# Patient Record
Sex: Male | Born: 1990 | Race: Black or African American | Hispanic: No | Marital: Married | Smoking: Never smoker
Health system: Southern US, Community
[De-identification: ages and names within clinical notes are randomized; demographics above are authoritative.]

## PROBLEM LIST (undated history)

## (undated) DIAGNOSIS — I499 Cardiac arrhythmia, unspecified: Secondary | ICD-10-CM

## (undated) DIAGNOSIS — I10 Essential (primary) hypertension: Secondary | ICD-10-CM

---

## 2008-07-20 ENCOUNTER — Other Ambulatory Visit: Payer: Self-pay | Admitting: Emergency Medicine

## 2008-07-20 ENCOUNTER — Inpatient Hospital Stay (HOSPITAL_COMMUNITY): Admission: AD | Admit: 2008-07-20 | Discharge: 2008-07-23 | Payer: Self-pay | Admitting: Internal Medicine

## 2008-07-20 ENCOUNTER — Encounter (INDEPENDENT_AMBULATORY_CARE_PROVIDER_SITE_OTHER): Payer: Self-pay | Admitting: Cardiology

## 2008-07-21 ENCOUNTER — Encounter (INDEPENDENT_AMBULATORY_CARE_PROVIDER_SITE_OTHER): Payer: Self-pay | Admitting: Internal Medicine

## 2008-08-30 ENCOUNTER — Emergency Department (HOSPITAL_COMMUNITY): Admission: EM | Admit: 2008-08-30 | Discharge: 2008-08-30 | Payer: Self-pay | Admitting: Emergency Medicine

## 2010-04-23 ENCOUNTER — Emergency Department (HOSPITAL_COMMUNITY)
Admission: EM | Admit: 2010-04-23 | Discharge: 2010-04-23 | Disposition: A | Discharge status: Home / Self Care | Payer: Self-pay | Attending: Emergency Medicine | Admitting: Emergency Medicine

## 2010-04-23 DIAGNOSIS — B86 Scabies: Secondary | ICD-10-CM | POA: Insufficient documentation

## 2010-06-03 LAB — CBC
HCT: 42.9 % (ref 39.0–52.0)
Hemoglobin: 14.4 g/dL (ref 13.0–17.0)
Hemoglobin: 14.7 g/dL (ref 13.0–17.0)
MCV: 91.1 fL (ref 78.0–100.0)
Platelets: 152 10*3/uL (ref 150–400)
RBC: 4.65 MIL/uL (ref 4.22–5.81)
RDW: 13.6 % (ref 11.5–15.5)
WBC: 6.1 10*3/uL (ref 4.0–10.5)
WBC: 6.3 10*3/uL (ref 4.0–10.5)

## 2010-06-03 LAB — GLUCOSE, CAPILLARY
Glucose-Capillary: 113 mg/dL — ABNORMAL HIGH (ref 70–99)
Glucose-Capillary: 74 mg/dL (ref 70–99)
Glucose-Capillary: 78 mg/dL (ref 70–99)
Glucose-Capillary: 93 mg/dL (ref 70–99)
Glucose-Capillary: 97 mg/dL (ref 70–99)
Glucose-Capillary: 98 mg/dL (ref 70–99)

## 2010-06-03 LAB — CARDIAC PANEL(CRET KIN+CKTOT+MB+TROPI)
CK, MB: 1.5 ng/mL (ref 0.3–4.0)
CK, MB: 1.7 ng/mL (ref 0.3–4.0)
CK, MB: 1.8 ng/mL (ref 0.3–4.0)
Relative Index: 0.3 (ref 0.0–2.5)
Relative Index: 0.3 (ref 0.0–2.5)
Relative Index: 0.3 (ref 0.0–2.5)
Total CK: 581 U/L — ABNORMAL HIGH (ref 7–232)
Total CK: 611 U/L — ABNORMAL HIGH (ref 7–232)
Troponin I: 0.01 ng/mL (ref 0.00–0.06)
Troponin I: 0.01 ng/mL (ref 0.00–0.06)
Troponin I: 0.02 ng/mL (ref 0.00–0.06)

## 2010-06-03 LAB — URINALYSIS, ROUTINE W REFLEX MICROSCOPIC
Glucose, UA: 100 mg/dL — AB
Ketones, ur: 15 mg/dL — AB
Specific Gravity, Urine: 1.025 (ref 1.005–1.030)
pH: 7 (ref 5.0–8.0)

## 2010-06-03 LAB — COMPREHENSIVE METABOLIC PANEL
ALT: 15 U/L (ref 0–53)
AST: 23 U/L (ref 0–37)
AST: 28 U/L (ref 0–37)
Albumin: 4 g/dL (ref 3.5–5.2)
Calcium: 8.3 mg/dL — ABNORMAL LOW (ref 8.4–10.5)
Chloride: 109 mEq/L (ref 96–112)
Creatinine, Ser: 1.42 mg/dL (ref 0.4–1.5)
GFR calc Af Amer: >60 mL/min (ref >60)
GFR calc Af Amer: >60 mL/min (ref >60)
Sodium: 139 mEq/L (ref 135–145)
Total Bilirubin: 1.3 mg/dL — ABNORMAL HIGH (ref 0.3–1.2)
Total Protein: 6.2 g/dL (ref 6.0–8.3)

## 2010-06-03 LAB — DIFFERENTIAL
Basophils Absolute: 0 10*3/uL (ref 0.0–0.1)
Eosinophils Relative: 2 % (ref 0–5)
Lymphocytes Relative: 18 % (ref 12–46)
Lymphs Abs: 1.1 10*3/uL (ref 0.7–4.0)
Monocytes Absolute: 0.7 10*3/uL (ref 0.1–1.0)

## 2010-06-03 LAB — BASIC METABOLIC PANEL
CO2: 28 mEq/L (ref 19–32)
CO2: 30 mEq/L (ref 19–32)
Calcium: 8.7 mg/dL (ref 8.4–10.5)
Calcium: 8.9 mg/dL (ref 8.4–10.5)
GFR calc Af Amer: >60 mL/min (ref >60)
GFR calc Af Amer: >60 mL/min (ref >60)
GFR calc non Af Amer: >60 mL/min (ref >60)
GFR calc non Af Amer: >60 mL/min (ref >60)
GFR calc non Af Amer: >60 mL/min (ref >60)
Glucose, Bld: 89 mg/dL (ref 70–99)
Glucose, Bld: 93 mg/dL (ref 70–99)
Potassium: 3.6 mEq/L (ref 3.5–5.1)
Potassium: 3.9 mEq/L (ref 3.5–5.1)
Sodium: 139 mEq/L (ref 135–145)
Sodium: 143 mEq/L (ref 135–145)
Sodium: 145 mEq/L (ref 135–145)

## 2010-06-03 LAB — RAPID URINE DRUG SCREEN, HOSP PERFORMED
Barbiturates: NOT DETECTED
Cocaine: NOT DETECTED
Opiates: NOT DETECTED

## 2010-06-03 LAB — PHOSPHORUS: Phosphorus: 3.6 mg/dL (ref 2.3–4.6)

## 2010-06-03 LAB — RAPID STREP SCREEN (MED CTR MEBANE ONLY): Streptococcus, Group A Screen (Direct): NEGATIVE

## 2010-06-03 LAB — POCT CARDIAC MARKERS
CKMB, poc: <1 ng/mL — ABNORMAL LOW (ref 1.0–8.0)
Troponin i, poc: <0.05 ng/mL (ref 0.00–0.09)

## 2010-06-03 LAB — TSH: TSH: 1.284 u[IU]/mL (ref 0.350–4.500)

## 2010-06-08 IMAGING — CR DG CHEST 2V
2 series · 2 of 2 positions shown · non-contrast
Comparison: None

CLINICAL DATA: Shortness of breath, chest pain, syncope

CHEST - 2 VIEW

[w chest pa]
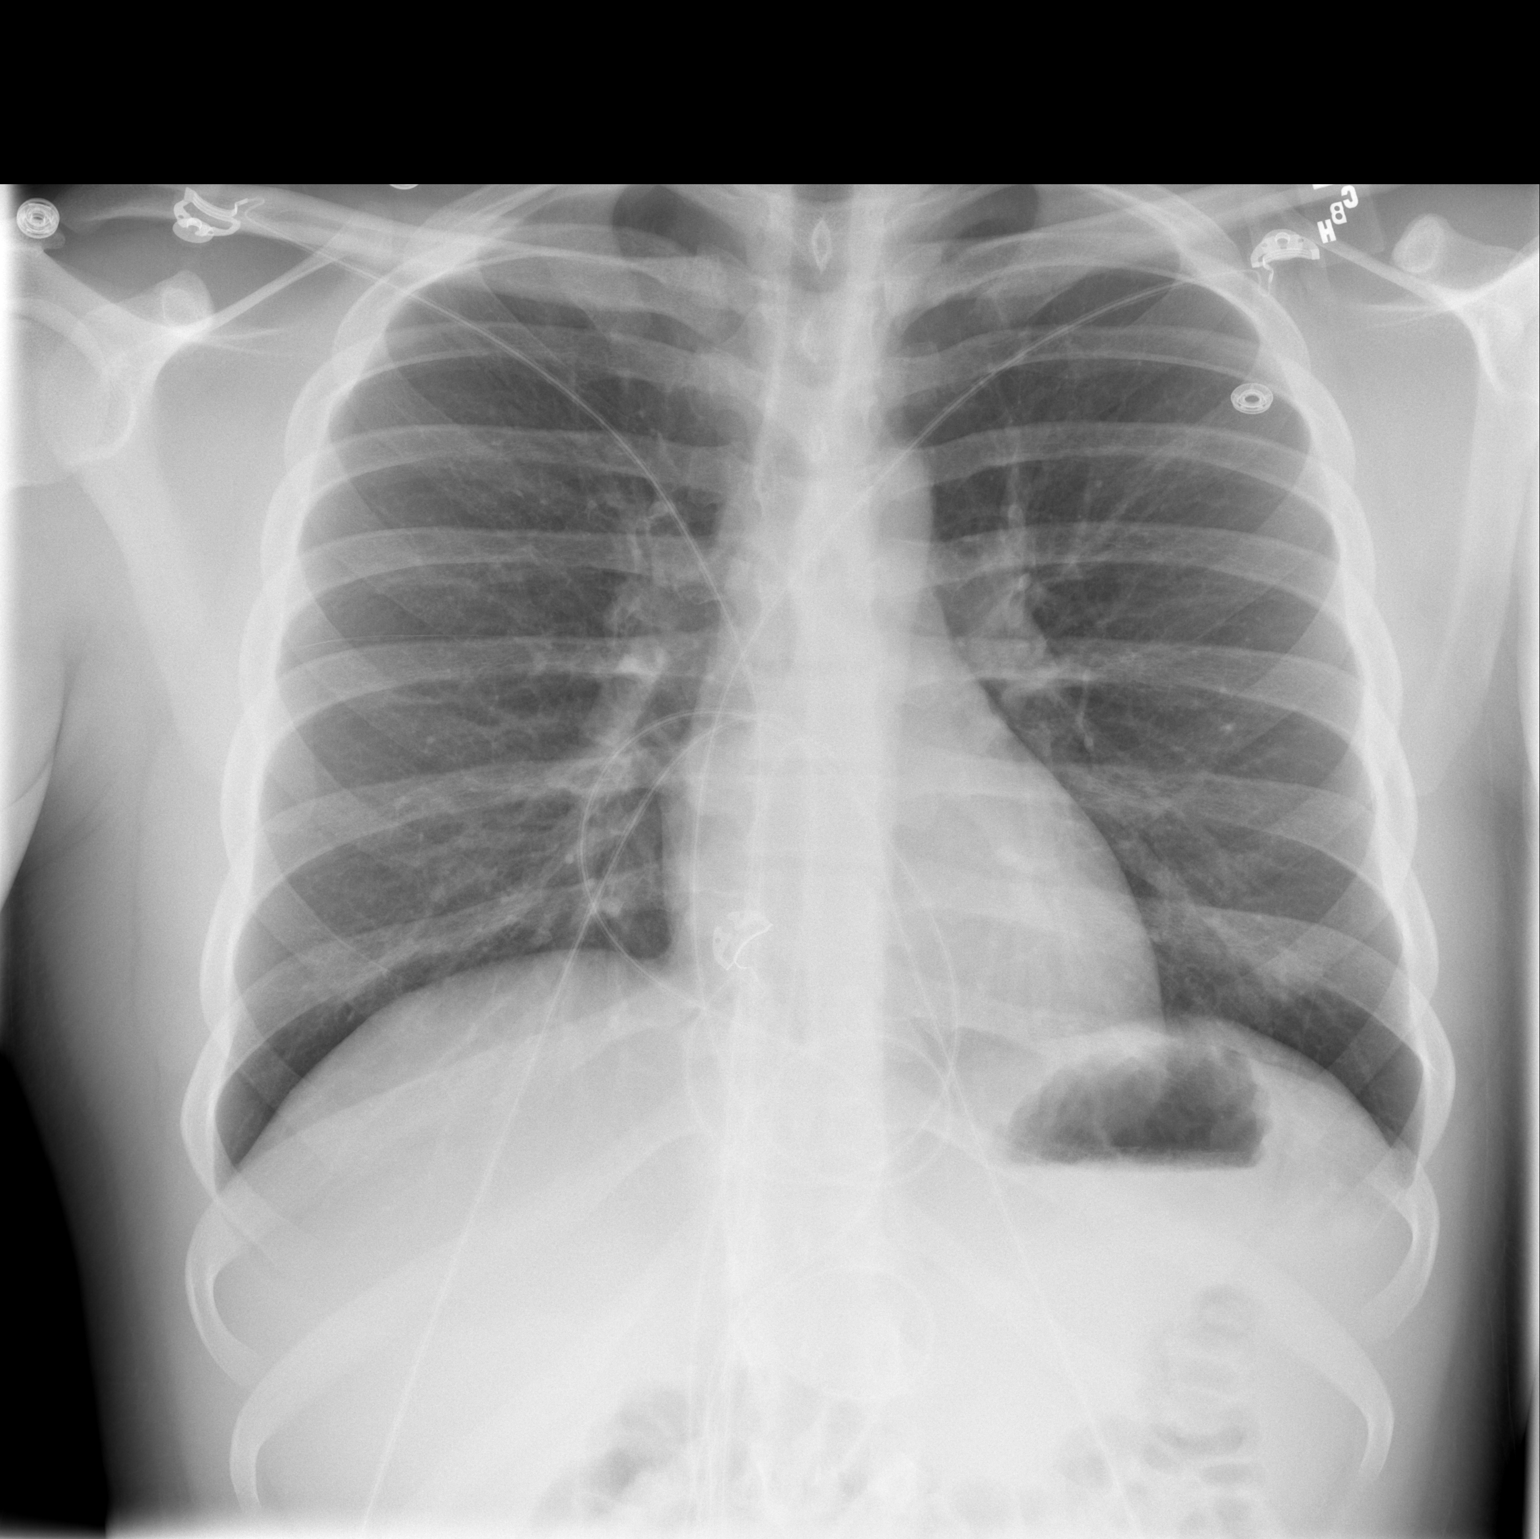

[w chest lat]
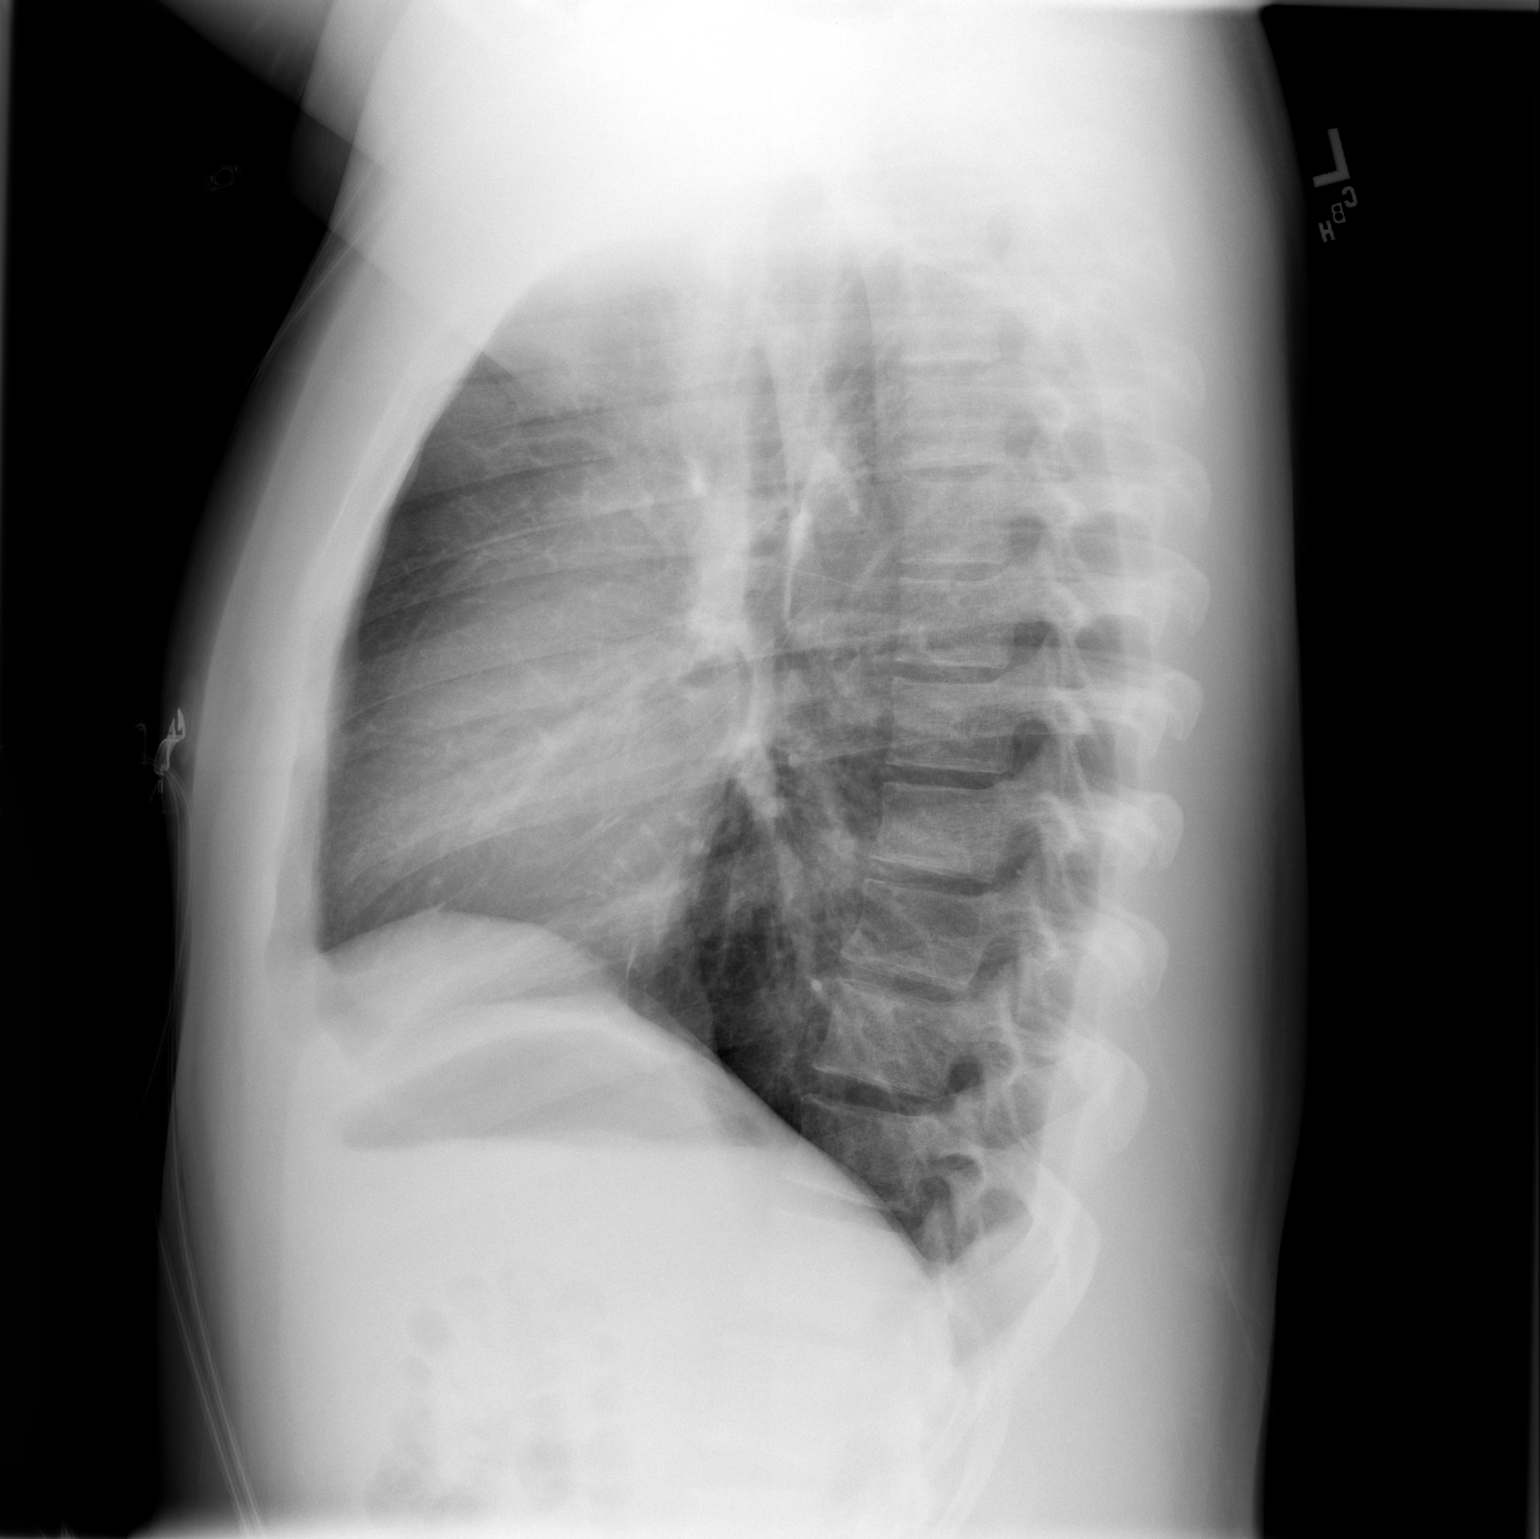

[2 of 2 positions shown; findings below may reference images not displayed]

FINDINGS: Cardiomediastinal silhouette is within normal limits. The
lungs are clear. No pleural effusion.  No pneumothorax.  No acute
osseous abnormality.
IMPRESSION: No acute cardiopulmonary process.

## 2010-07-08 NOTE — Discharge Summary (Signed)
NAMEROCKEY, GUARINO               ACCOUNT NO.:  [id number]   MEDICAL RECORD NO.:  [id number]          PATIENT TYPE:  INP   LOCATION:  5033                         FACILITY:  [facility]   PHYSICIAN:  Peggye Pitt, M.D. DATE OF BIRTH:  June 19, 1990   DATE OF ADMISSION:  07/20/2008  DATE OF DISCHARGE:  07/23/2008                               DISCHARGE SUMMARY   DISCHARGE DIAGNOSES:  1. Chest pain, likely secondary to gastroesophageal reflux disease.  2. Vasovagal syncope.  3. Hypertension.  4. Hypokalemia.   Discharge medications include:  1. Protonix 40 mg p.o. twice daily.  2. Lisinopril 10 mg daily.  3. Hydrochlorothiazide 12.5 mg daily.   DISPOSITION AND FOLLOWUP:  Mr. Jason Werner is discharged home today in  stable condition.  He has selected Dr. Della Goo as his new  primary care physician.  He has been provided with her office number to  call for a hospital followup appointment.  At that time, a BMET should  be drawn to follow his renal function, a newly started ACE inhibitor and  hydrochlorothiazide for blood pressure.  He has also been instructed to  call Dr. Jacinto Halim with [facility] for an outpatient  stress test that Dr. Jacinto Halim suggest that he have.   CONSULTATION IN THIS HOSPITALIZATION:  Vonna Kotyk R. Jacinto Halim, MD with Cardiology.   Images and procedures include:  1. A chest x-ray on Jul 20, 2008, that showed no acute cardiopulmonary      process.  2. A CT angiogram of the chest on Jul 21, 2008, that showed no      pulmonary embolism with trace left pleural effusion and minimal      left lower lobe atelectasis.  3. He also had a 2D echocardiogram on Jul 21, 2008, that showed an      ejection fraction of 60-65% with no wall motion abnormalities, no      diastolic dysfunction.   HISTORY AND PHYSICAL EXAMINATION:  For full details, please see  dictation by Dr. Oralia Rud on Jul 20, 2008, but in brief, Jason Werner is a  20 year old African American young man  with no significant past medical  history who was transferred from the [facility] Emergency Department  secondary to a syncopal event with abnormal EKG changes consistent of  inferolateral T-wave inversions.   HOSPITAL COURSE:  1. Chest pain.  Because of an elevated D-dimer, a CT scan was done      that showed no evidence of a pulmonary embolism.  However, we were      concerned for a cardiac etiology given his profound T-wave      inversions in the anterolateral leads on his EKG.  A 2D echo was      performed and that was essentially normal as above.  Cardiology was      consulted and they think that the chest pain is likely secondary to      GERD, however, they have recommended that he have an outpatient      stress test given his EKG changes.  The patient has been provided  with number for Dr. Jacinto Halim to call and schedule this appointment.      Dr. 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  2. Syncopal event, most likely a vasovagal.  He did not have any      evidence of hypertrophic obstructive cardiomyopathy on his EKG.  3. Blood pressure.  He did have elevated blood pressures to 150s-160s      systolic while in the hospital.  He has been started on lisinopril      and hydrochlorothiazide and this will need to be followed up in the      outpatient setting.  4. Rest of the issue is not a problem in this hospitalization.  5. Vial signs on the day of discharge; blood pressure 130/82, heart      rate 75, respirations 28, O2 sats 98% on room air with a      temperature of 98.1.  6. Labs on the day of discharge; sodium 139, potassium 3.4, chloride      104, bicarb 28, BUN 6, creatinine 1.23 with a glucose of 108.  WBC      6.3, hemoglobin 14.7, and a platelet count of 147.      Peggye Pitt, M.D.  Electronically Signed     EH/MEDQ  D:  07/23/2008  T:   07/23/2008  Job:  [id number]   cc:   Della Goo, M.D.  Cristy Hilts. Jacinto Halim, MD

## 2010-07-08 NOTE — H&P (Signed)
Jason Werner, Jason Werner               ACCOUNT NO.:  [id number]   MEDICAL RECORD NO.:  [id number]          PATIENT TYPE:  INP   LOCATION:  4704                         FACILITY:  [facility]   PHYSICIAN:  Darryl D. Prime, MD    DATE OF BIRTH:  11-30-1990   DATE OF ADMISSION:  07/20/2008  DATE OF DISCHARGE:                              HISTORY & PHYSICAL   The patient is full code.  He has no primary care physician.  He is  being admitted to [facility].   CHIEF COMPLAINT:  He passed out.   HISTORY OF PRESENT ILLNESS:  Jason Werner is a 20 year old male with no  significant past medical history.  He was transferred from [facility] ED  for low blood sugar, altered mental status, syncope, abnormal EKG,  nausea, and vomiting.  The patient notes that he had a sore throat  starting on last night.  He does have frequent sore throats.  His sore  throat was so severe, he had significantly decreased p.o. intake over  the last 12 hours.  The patient apparently had nausea and vomiting this  morning before he went into work outside of the [employer] where  he works.  He then because of the severity of the sore throat took a  Motrin and then sat down.  The patient notes shortly thereafter, he  passed out and felt something hard on his head when he came to, unsure  of how long he was out, no chest pain or palpitations prior to passing  out.  He notes people trying to call him but he was unable to respond.  On transport via EMS, in the ambulance, he notes he came to sometimes  but went out sometimes.  A blood sugar checked by EMS was 53.  The  patient in the ED noted chest pain for 5 minutes, sharp, substernal.  There was no radiation, no sweats or shortness of breath when it  occurred.  The patient notes he does have occasional chest pains for  many years.  He also has decreased exercise tolerance, he thinks when  compared to his peers when he plays football.  The patient denies any  alcohol or illicit drug use.  He denies any tobacco use.  The patient in  the emergency room was given IV fluids, ketorolac IV, Zofran IV, and D50  and notes feeling better.  The patient denies any cough, dysuria or  fever.  He denies any rashes, joint pain or swelling.  He denies any  black stools or bloody stools, no diarrhea.   PAST MEDICAL HISTORY AND PAST SURGICAL HISTORY:  None.   ALLERGIES:  No known drug allergies.   MEDICATIONS:  None.   SOCIAL HISTORY:  No tobacco, alcohol or drug use.   FAMILY HISTORY:  No diabetes in the family, no premature coronary artery  disease.   REVIEW OF SYSTEMS:  A 14 point review of systems is negative unless  stated above.   PHYSICAL EXAMINATION:  VITAL SIGNS:  He is afebrile at 98.8 with a  respiratory rate of 14,  pulse 78, blood pressure 131/69.  HEENT:  Normocephalic, atraumatic.  Pupils equal, round, and reactive to  light with extraocular movements being intact.  The oropharynx reveals  significant erythema in the posterior oropharynx but no signs of  exudate.  NECK:  Supple with no lymphadenopathy or thyromegaly.  No carotid  bruits.  No jugular venous distention.  LUNGS:  Clear to auscultation bilaterally.  CARDIOVASCULAR:  Regular rate and rhythm.  He does have a 2/6 systolic  ejection murmur heard loudest at the left upper sternal border and does  not get louder with Valsalva.  No rubs or gallops.  Normal S1, S2.  No  S3 or S4.  ABDOMEN:  Soft, nontender and nondistended with no hepatosplenomegaly.  EXTREMITIES:  No clubbing, cyanosis or edema.  NEUROLOGIC:  He is alert and oriented times four with cranial nerves II-  XII grossly intact.  Strength and sensation are grossly intact.  SKIN:  No rashes.  LYMPHATIC EXAM:  No lower extremity edema.  Shotty anterior cervical  lymphadenopathy noted.  PSYCHIATRIC:  Alert and oriented times four with normal mood and affect.  MUSCULOSKELETAL:  Full range of motion with no signs of joint   deformities.   LABORATORY DATA:  UDS is positive for THC.  Sodium 143, potassium 3.2,  chloride 109, bicarb 28, BUN 7, creatinine 1.42.  I do not have a  baseline for his blood work.  Normal liver function tests.  White count  6.1, hemoglobin 14.2, hematocrit 39.9, platelets 152, segs 69.  Cardiac  markers at 1616 were negative.  He had a negative group A Strep screen  swab and a mono spot screen negative.  The patient's EKG showed sinus  rhythm at a vent rate of 78 beats per minute.  He has T-wave inversions,  some asymmetric, some symmetric, V3-V6 and also in I and aVL.  PR  interval 156, QRS 102, QT directed 421.  No prior EKG to compare.   ASSESSMENT/PLAN:  This is a 20 year old male with a history of recent  sore throat, nausea and vomiting and now presents with decreased blood  sugar.  He has an EKG.  He notes chest pain at times including today.  He has apparent decreased exercise tolerance perceived.  He is  hypokalemic at this time.  1. For his low blood sugar, we will check a hemoglobin A1c and a TSH      to rule out diabetes or endocrinopathy.  Will check his blood      sugars q.4 hours and feed him now as the initial workup.  For his      abnormal EKG at his initial workup, we will get a D-dimer to rule      out PE and cardiac markers times three.  Telemetry will be ongoing.      We will get an echocardiogram and check a B type natruretic peptide      and a chest x-ray.  2. Syncope, for initial workup, we will have him on telemetry.  He      will be given IV fluids and we will feed him.  Will check      orthostatics and a urinalysis and echo as above.  3. For his sore throat, he will have Chloraseptic spray at the bedside      but further workup for diagnosis may have a low yield.  4. GI and DVT prophylaxis will be ordered.      Darryl D. Prime, MD  Electronically Signed  DDP/MEDQ  D:  07/20/2008  T:  07/20/2008  Job:  [id number]

## 2015-04-02 ENCOUNTER — Encounter (HOSPITAL_COMMUNITY): Payer: Self-pay

## 2015-04-02 ENCOUNTER — Emergency Department (HOSPITAL_COMMUNITY): Payer: Self-pay

## 2015-04-02 ENCOUNTER — Emergency Department (HOSPITAL_COMMUNITY)
Admission: EM | Admit: 2015-04-02 | Discharge: 2015-04-02 | Disposition: A | Discharge status: Home / Self Care | Payer: Self-pay | Attending: Emergency Medicine | Admitting: Emergency Medicine

## 2015-04-02 DIAGNOSIS — N390 Urinary tract infection, site not specified: Secondary | ICD-10-CM | POA: Insufficient documentation

## 2015-04-02 DIAGNOSIS — F17210 Nicotine dependence, cigarettes, uncomplicated: Secondary | ICD-10-CM | POA: Insufficient documentation

## 2015-04-02 HISTORY — DX: Cardiac arrhythmia, unspecified: I49.9

## 2015-04-02 LAB — COMPREHENSIVE METABOLIC PANEL
ALBUMIN: 4.2 g/dL (ref 3.5–5.0)
ALK PHOS: 49 U/L (ref 38–126)
ALT: 22 U/L (ref 17–63)
AST: 27 U/L (ref 15–41)
Anion gap: 6 (ref 5–15)
BUN: 14 mg/dL (ref 6–20)
CALCIUM: 9.1 mg/dL (ref 8.9–10.3)
CHLORIDE: 106 mmol/L (ref 101–111)
CO2: 28 mmol/L (ref 22–32)
CREATININE: 1.29 mg/dL — AB (ref 0.61–1.24)
GFR calc non Af Amer: >60 mL/min (ref >60)
GLUCOSE: 81 mg/dL (ref 65–99)
Potassium: 4.2 mmol/L (ref 3.5–5.1)
SODIUM: 140 mmol/L (ref 135–145)
Total Bilirubin: 0.8 mg/dL (ref 0.3–1.2)
Total Protein: 7.6 g/dL (ref 6.5–8.1)

## 2015-04-02 LAB — URINALYSIS, ROUTINE W REFLEX MICROSCOPIC
BILIRUBIN URINE: NEGATIVE
GLUCOSE, UA: NEGATIVE mg/dL
Hgb urine dipstick: NEGATIVE
Nitrite: NEGATIVE
PH: 6 (ref 5.0–8.0)
Specific Gravity, Urine: 1.025 (ref 1.005–1.030)

## 2015-04-02 LAB — URINE MICROSCOPIC-ADD ON

## 2015-04-02 LAB — CBC WITH DIFFERENTIAL/PLATELET
BASOS ABS: 0 10*3/uL (ref 0.0–0.1)
BASOS PCT: 0 %
EOS ABS: 0.9 10*3/uL — AB (ref 0.0–0.7)
EOS PCT: 15 %
HCT: 44.8 % (ref 39.0–52.0)
HEMOGLOBIN: 15.6 g/dL (ref 13.0–17.0)
Lymphocytes Relative: 35 %
Lymphs Abs: 2 10*3/uL (ref 0.7–4.0)
MCH: 31.8 pg (ref 26.0–34.0)
MCHC: 34.8 g/dL (ref 30.0–36.0)
MCV: 91.2 fL (ref 78.0–100.0)
MONO ABS: 0.4 10*3/uL (ref 0.1–1.0)
MONOS PCT: 7 %
NEUTROS ABS: 2.6 10*3/uL (ref 1.7–7.7)
Neutrophils Relative %: 43 %
PLATELETS: 175 10*3/uL (ref 150–400)
RBC: 4.91 MIL/uL (ref 4.22–5.81)
RDW: 12.7 % (ref 11.5–15.5)
WBC: 5.9 10*3/uL (ref 4.0–10.5)

## 2015-04-02 MED ORDER — HYDROMORPHONE HCL 1 MG/ML IJ SOLN
0.5000 mg | Freq: Once | INTRAMUSCULAR | Status: AC
  Administered 2015-04-02: 0.5 mg via INTRAVENOUS
  Filled 2015-04-02: qty 1

## 2015-04-02 MED ORDER — DEXTROSE 5 % IV SOLN
1.0000 g | Freq: Once | INTRAVENOUS | Status: AC
  Administered 2015-04-02: 1 g via INTRAVENOUS
  Filled 2015-04-02: qty 10

## 2015-04-02 MED ORDER — TRAMADOL HCL 50 MG PO TABS: 50.0000 mg | ORAL_TABLET | Freq: Four times a day (QID) | ORAL | Status: AC | PRN

## 2015-04-02 MED ORDER — ONDANSETRON HCL 4 MG/2ML IJ SOLN
4.0000 mg | Freq: Once | INTRAMUSCULAR | Status: AC
  Administered 2015-04-02: 4 mg via INTRAVENOUS
  Filled 2015-04-02: qty 2

## 2015-04-02 MED ORDER — CEPHALEXIN 500 MG PO CAPS: 500.0000 mg | ORAL_CAPSULE | Freq: Four times a day (QID) | ORAL | Status: DC

## 2015-04-02 NOTE — ED Provider Notes (Signed)
CSN: [id number]     Arrival date & time 04/02/15  1550 History   First MD Initiated Contact with Patient 04/02/15 1940     Chief Complaint  Patient presents with  . Hematuria     (Consider location/radiation/quality/duration/timing/severity/associated sxs/prior Treatment) Patient is a 25 y.o. male presenting with hematuria. The history is provided by the patient (Patient complains of left flank pain and suprapubic pain no fever chills).  Hematuria This is a new problem. The current episode started yesterday. The problem occurs constantly. The problem has not changed since onset.Associated symptoms include abdominal pain. Pertinent negatives include no chest pain and no headaches. Nothing aggravates the symptoms. Nothing relieves the symptoms.    Past Medical History  Diagnosis Date  . Irregular heart beat    History reviewed. No pertinent past surgical history. No family history on file. Social History  Substance Use Topics  . Smoking status: Current Every Day Smoker  . Smokeless tobacco: None  . Alcohol Use: No    Review of Systems  Constitutional: Negative for appetite change and fatigue.  HENT: Negative for congestion, ear discharge and sinus pressure.   Eyes: Negative for discharge.  Respiratory: Negative for cough.   Cardiovascular: Negative for chest pain.  Gastrointestinal: Positive for abdominal pain. Negative for diarrhea.  Genitourinary: Positive for hematuria. Negative for frequency.  Musculoskeletal: Negative for back pain.  Skin: Negative for rash.  Neurological: Negative for seizures and headaches.  Psychiatric/Behavioral: Negative for hallucinations.      Allergies  Review of patient's allergies indicates no known allergies.  Home Medications   Prior to Admission medications   Medication Sig Start Date End Date Taking? Authorizing Provider  ibuprofen (ADVIL,MOTRIN) 200 MG tablet Take 200 mg by mouth every 6 (six) hours as needed for mild pain.   Yes  Historical Provider, MD  cephALEXin (KEFLEX) 500 MG capsule Take 1 capsule (500 mg total) by mouth 4 (four) times daily. 04/02/15   Bethann Berkshire, MD  traMADol (ULTRAM) 50 MG tablet Take 1 tablet (50 mg total) by mouth every 6 (six) hours as needed. 04/02/15   Bethann Berkshire, MD   BP 122/67 mmHg  Pulse 61  Temp(Src) 98.6 F (37 C) (Oral)  Resp 16  Ht  (1.803 m)  Wt 250 lb (113.399 kg)  BMI 34.88 kg/m2  SpO2 100% Physical Exam  Constitutional: He is oriented to person, place, and time. He appears well-developed.  HENT:  Head: Normocephalic.  Eyes: Conjunctivae and EOM are normal. No scleral icterus.  Neck: Neck supple. No thyromegaly present.  Cardiovascular: Normal rate and regular rhythm.  Exam reveals no gallop and no friction rub.   No murmur heard. Pulmonary/Chest: No stridor. He has no wheezes. He has no rales. He exhibits no tenderness.  Abdominal: He exhibits no distension. There is no tenderness. There is no rebound.  Mild suprapubic tender  Genitourinary:  Tender left flank  Musculoskeletal: Normal range of motion. He exhibits no edema.  Lymphadenopathy:    He has no cervical adenopathy.  Neurological: He is oriented to person, place, and time. He exhibits normal muscle tone. Coordination normal.  Skin: No rash noted. No erythema.  Psychiatric: He has a normal mood and affect. His behavior is normal.    ED Course  Procedures (including critical care time) Labs Review Labs Reviewed  URINALYSIS, ROUTINE W REFLEX MICROSCOPIC (NOT AT [facility]) - Abnormal; Notable for the following:    Ketones, ur TRACE (*)    Protein, ur TRACE (*)  Leukocytes, UA SMALL (*)    All other components within normal limits  CBC WITH DIFFERENTIAL/PLATELET - Abnormal; Notable for the following:    Eosinophils Absolute 0.9 (*)    All other components within normal limits  COMPREHENSIVE METABOLIC PANEL - Abnormal; Notable for the following:    Creatinine, Ser 1.29 (*)    All other components  within normal limits  URINE MICROSCOPIC-ADD ON - Abnormal; Notable for the following:    Squamous Epithelial / LPF 0-5 (*)    Bacteria, UA FEW (*)    All other components within normal limits  URINE CULTURE    Imaging Review Ct Renal Stone Study  04/02/2015  CLINICAL DATA:  Right lower quadrant pain radiating into the right groin and hematuria for 3 days. Initial encounter. EXAM: CT ABDOMEN AND PELVIS WITHOUT CONTRAST TECHNIQUE: Multidetector CT imaging of the abdomen and pelvis was performed following the standard protocol without IV contrast. COMPARISON:  None. FINDINGS: The lung bases are clear.  No pleural or pericardial effusion. There are no renal or ureteral stones on the right or left. No hydronephrosis is identified. The kidneys, ureters and urinary bladder are unremarkable in appearance. Seminal vesicles and prostate gland appear normal. The gallbladder, liver, spleen, adrenal glands and pancreas appear normal. The stomach, small and large bowel and appendix appear normal. No lymphadenopathy or fluid. Bones demonstrate a unilateral pars interarticularis defect on the left at L5 without anterolisthesis. No lytic or sclerotic bony lesion is identified. IMPRESSION: Negative for urinary tract stone. No acute abnormality or finding to explain the patient's symptoms. 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 Electronically Signed   By: Drusilla Kanner M.D.   On: 04/02/2015 21:01   I have personally reviewed and evaluated these images and lab results as part of my medical decision-making.   EKG Interpretation None      MDM   Final diagnoses:  UTI (lower urinary tract infection)    Labs consistent with urinary tract infection CT scan was negative patient will be put on Keflex and Ultram and will follow-up with a PCP    Bethann Berkshire, MD 04/02/15 2152

## 2015-04-02 NOTE — Discharge Instructions (Signed)
Follow-up with [facility] in [place] in one week drink plenty of fluids

## 2015-04-02 NOTE — ED Notes (Signed)
Pt reports pain in r lower quad radiating to groin and has seen blood in urine x 3 days.  Reports pain is in back at times but mostly in front.  Denies history of kidney stones.

## 2015-04-04 LAB — URINE CULTURE

## 2017-10-09 ENCOUNTER — Observation Stay (HOSPITAL_COMMUNITY)
Admission: EM | Admit: 2017-10-09 | Discharge: 2017-10-11 | Disposition: A | Discharge status: Home / Self Care | Payer: Worker's Compensation | Attending: General Surgery | Admitting: General Surgery

## 2017-10-09 ENCOUNTER — Encounter (HOSPITAL_COMMUNITY): Payer: Self-pay

## 2017-10-09 ENCOUNTER — Emergency Department (HOSPITAL_COMMUNITY): Payer: Worker's Compensation

## 2017-10-09 DIAGNOSIS — S20219A Contusion of unspecified front wall of thorax, initial encounter: Secondary | ICD-10-CM | POA: Diagnosis not present

## 2017-10-09 DIAGNOSIS — F430 Acute stress reaction: Secondary | ICD-10-CM | POA: Diagnosis not present

## 2017-10-09 DIAGNOSIS — Y92410 Unspecified street and highway as the place of occurrence of the external cause: Secondary | ICD-10-CM | POA: Diagnosis not present

## 2017-10-09 DIAGNOSIS — N179 Acute kidney failure, unspecified: Secondary | ICD-10-CM | POA: Diagnosis not present

## 2017-10-09 DIAGNOSIS — T07XXXA Unspecified multiple injuries, initial encounter: Secondary | ICD-10-CM

## 2017-10-09 DIAGNOSIS — S060X9A Concussion with loss of consciousness of unspecified duration, initial encounter: Secondary | ICD-10-CM | POA: Diagnosis present

## 2017-10-09 DIAGNOSIS — S069X1A Unspecified intracranial injury with loss of consciousness of 30 minutes or less, initial encounter: Secondary | ICD-10-CM

## 2017-10-09 DIAGNOSIS — S50311A Abrasion of right elbow, initial encounter: Secondary | ICD-10-CM | POA: Insufficient documentation

## 2017-10-09 DIAGNOSIS — S060X1A Concussion with loss of consciousness of 30 minutes or less, initial encounter: Secondary | ICD-10-CM | POA: Diagnosis present

## 2017-10-09 DIAGNOSIS — S060XAA Concussion with loss of consciousness status unknown, initial encounter: Secondary | ICD-10-CM | POA: Diagnosis present

## 2017-10-09 DIAGNOSIS — R404 Transient alteration of awareness: Secondary | ICD-10-CM

## 2017-10-09 HISTORY — DX: Essential (primary) hypertension: I10

## 2017-10-09 LAB — CBC
HCT: 47.7 % (ref 39.0–52.0)
HCT: 45.8 % (ref 39.0–52.0)
HEMOGLOBIN: 15.6 g/dL (ref 13.0–17.0)
Hemoglobin: 15.7 g/dL (ref 13.0–17.0)
MCH: 30.8 pg (ref 26.0–34.0)
MCH: 31.3 pg (ref 26.0–34.0)
MCHC: 32.9 g/dL (ref 30.0–36.0)
MCHC: 34.1 g/dL (ref 30.0–36.0)
MCV: 93.5 fL (ref 78.0–100.0)
MCV: 92 fL (ref 78.0–100.0)
PLATELETS: 194 10*3/uL (ref 150–400)
PLATELETS: 186 10*3/uL (ref 150–400)
RBC: 5.1 MIL/uL (ref 4.22–5.81)
RBC: 4.98 MIL/uL (ref 4.22–5.81)
RDW: 12.9 % (ref 11.5–15.5)
RDW: 12.6 % (ref 11.5–15.5)
WBC: 9.7 10*3/uL (ref 4.0–10.5)
WBC: 12.2 10*3/uL — AB (ref 4.0–10.5)

## 2017-10-09 LAB — PROTIME-INR
INR: 1.02
Prothrombin Time: 13.3 seconds (ref 11.4–15.2)

## 2017-10-09 LAB — I-STAT CHEM 8, ED
BUN: 12 mg/dL (ref 6–20)
Calcium, Ion: 1.05 mmol/L — ABNORMAL LOW (ref 1.15–1.40)
Chloride: 108 mmol/L (ref 98–111)
Creatinine, Ser: 1.6 mg/dL — ABNORMAL HIGH (ref 0.61–1.24)
GLUCOSE: 90 mg/dL (ref 70–99)
HEMATOCRIT: 48 % (ref 39.0–52.0)
Hemoglobin: 16.3 g/dL (ref 13.0–17.0)
POTASSIUM: 4 mmol/L (ref 3.5–5.1)
Sodium: 142 mmol/L (ref 135–145)
TCO2: 21 mmol/L — ABNORMAL LOW (ref 22–32)

## 2017-10-09 LAB — COMPREHENSIVE METABOLIC PANEL
ALT: 24 U/L (ref 0–44)
AST: 35 U/L (ref 15–41)
Albumin: 4.1 g/dL (ref 3.5–5.0)
Alkaline Phosphatase: 52 U/L (ref 38–126)
Anion gap: 9 (ref 5–15)
BUN: 11 mg/dL (ref 6–20)
CO2: 22 mmol/L (ref 22–32)
CREATININE: 1.72 mg/dL — AB (ref 0.61–1.24)
Calcium: 8.9 mg/dL (ref 8.9–10.3)
Chloride: 110 mmol/L (ref 98–111)
GFR calc non Af Amer: 52 mL/min — ABNORMAL LOW (ref >60)
Glucose, Bld: 97 mg/dL (ref 70–99)
Potassium: 4.1 mmol/L (ref 3.5–5.1)
SODIUM: 141 mmol/L (ref 135–145)
Total Bilirubin: 0.8 mg/dL (ref 0.3–1.2)
Total Protein: 6.6 g/dL (ref 6.5–8.1)

## 2017-10-09 LAB — CREATININE, SERUM
Creatinine, Ser: 1.63 mg/dL — ABNORMAL HIGH (ref 0.61–1.24)
GFR calc non Af Amer: 56 mL/min — ABNORMAL LOW (ref >60)

## 2017-10-09 LAB — ETHANOL

## 2017-10-09 LAB — SAMPLE TO BLOOD BANK

## 2017-10-09 LAB — TROPONIN I: Troponin I: <0.03 ng/mL (ref <0.03)

## 2017-10-09 LAB — CDS SEROLOGY

## 2017-10-09 MED ORDER — MORPHINE SULFATE (PF) 2 MG/ML IV SOLN
1.0000 mg | INTRAVENOUS | Status: DC | PRN
  Administered 2017-10-09: 1 mg via INTRAVENOUS
  Filled 2017-10-09: qty 1

## 2017-10-09 MED ORDER — ONDANSETRON HCL 4 MG/2ML IJ SOLN: 4.0000 mg | Freq: Four times a day (QID) | INTRAMUSCULAR | Status: DC | PRN

## 2017-10-09 MED ORDER — FENTANYL CITRATE (PF) 100 MCG/2ML IJ SOLN
INTRAMUSCULAR | Status: AC
  Administered 2017-10-09: 09:00:00
  Filled 2017-10-09: qty 2

## 2017-10-09 MED ORDER — DOCUSATE SODIUM 100 MG PO CAPS
100.0000 mg | ORAL_CAPSULE | Freq: Two times a day (BID) | ORAL | Status: DC
  Administered 2017-10-09 – 2017-10-11 (×4): 100 mg via ORAL
  Filled 2017-10-09 (×4): qty 1

## 2017-10-09 MED ORDER — METHOCARBAMOL 500 MG PO TABS
500.0000 mg | ORAL_TABLET | Freq: Three times a day (TID) | ORAL | Status: DC | PRN
  Administered 2017-10-09: 500 mg via ORAL
  Filled 2017-10-09: qty 1

## 2017-10-09 MED ORDER — IOPAMIDOL (ISOVUE-300) INJECTION 61%
INTRAVENOUS | Status: AC
  Administered 2017-10-09: 100 mL
  Filled 2017-10-09: qty 100

## 2017-10-09 MED ORDER — ENOXAPARIN SODIUM 40 MG/0.4ML ~~LOC~~ SOLN
40.0000 mg | Freq: Every day | SUBCUTANEOUS | Status: DC
  Administered 2017-10-09 – 2017-10-10 (×2): 40 mg via SUBCUTANEOUS
  Filled 2017-10-09 (×2): qty 0.4

## 2017-10-09 MED ORDER — SODIUM CHLORIDE 0.9 % IV SOLN
INTRAVENOUS | Status: DC
  Administered 2017-10-09 – 2017-10-11 (×6): via INTRAVENOUS

## 2017-10-09 MED ORDER — ONDANSETRON 4 MG PO TBDP: 4.0000 mg | ORAL_TABLET | Freq: Four times a day (QID) | ORAL | Status: DC | PRN

## 2017-10-09 MED ORDER — TETANUS-DIPHTH-ACELL PERTUSSIS 5-2.5-18.5 LF-MCG/0.5 IM SUSP
0.5000 mL | Freq: Once | INTRAMUSCULAR | Status: AC
  Administered 2017-10-09: 0.5 mL via INTRAMUSCULAR
  Filled 2017-10-09: qty 0.5

## 2017-10-09 MED ORDER — OXYCODONE HCL 5 MG PO TABS
5.0000 mg | ORAL_TABLET | ORAL | Status: DC | PRN
Start: 2017-10-09 — End: 2017-10-11
  Administered 2017-10-09 – 2017-10-11 (×2): 10 mg via ORAL
  Filled 2017-10-09 (×2): qty 2

## 2017-10-09 MED ORDER — ACETAMINOPHEN 500 MG PO TABS
1000.0000 mg | ORAL_TABLET | Freq: Three times a day (TID) | ORAL | Status: DC
  Administered 2017-10-09 – 2017-10-11 (×6): 1000 mg via ORAL
  Filled 2017-10-09 (×6): qty 2

## 2017-10-09 MED ORDER — NICOTINE 14 MG/24HR TD PT24
14.0000 mg | MEDICATED_PATCH | Freq: Every day | TRANSDERMAL | Status: DC
  Administered 2017-10-09 – 2017-10-11 (×3): 14 mg via TRANSDERMAL
  Filled 2017-10-09 (×3): qty 1

## 2017-10-09 NOTE — Progress Notes (Signed)
Orthopedic Tech Progress Note Patient Details:  Jason Werner 09/26/1990 [id number]  Patient ID: Jason Werner, male   DOB: 09/26/1990, 27 y.o.   MRN: [id number]   [name], Aviannah Castoro 10/09/2017, 9:26 AM Made level 1 trauma visit

## 2017-10-09 NOTE — Progress Notes (Signed)
Orthopedic Tech Progress Note Patient Details:  Jason PhiMykel Werner Apr 30, 1990 [id number]  Patient ID: Jason Werner, male   DOB: Apr 30, 1990, [age] y.o.   MRN: [id number]   [name], [name] 10/09/2017, 9:20 AM Made level 1 trauma visit
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
## 2017-10-10 LAB — CBC
HEMATOCRIT: 45.7 % (ref 39.0–52.0)
HEMOGLOBIN: 15.2 g/dL (ref 13.0–17.0)
MCH: 31.1 pg (ref 26.0–34.0)
MCHC: 33.3 g/dL (ref 30.0–36.0)
MCV: 93.6 fL (ref 78.0–100.0)
Platelets: 162 10*3/uL (ref 150–400)
RBC: 4.88 MIL/uL (ref 4.22–5.81)
RDW: 12.8 % (ref 11.5–15.5)
WBC: 8.5 10*3/uL (ref 4.0–10.5)

## 2017-10-10 LAB — BASIC METABOLIC PANEL
ANION GAP: 5 (ref 5–15)
BUN: 7 mg/dL (ref 6–20)
CHLORIDE: 113 mmol/L — AB (ref 98–111)
CO2: 22 mmol/L (ref 22–32)
Calcium: 8.1 mg/dL — ABNORMAL LOW (ref 8.9–10.3)
Creatinine, Ser: 1.41 mg/dL — ABNORMAL HIGH (ref 0.61–1.24)
GFR calc non Af Amer: >60 mL/min (ref >60)
Glucose, Bld: 85 mg/dL (ref 70–99)
POTASSIUM: 3.7 mmol/L (ref 3.5–5.1)
Sodium: 140 mmol/L (ref 135–145)

## 2017-10-10 LAB — HIV ANTIBODY (ROUTINE TESTING W REFLEX): HIV SCREEN 4TH GENERATION: NONREACTIVE

## 2017-10-10 MED ORDER — BACITRACIN-NEOMYCIN-POLYMYXIN OINTMENT TUBE
TOPICAL_OINTMENT | Freq: Three times a day (TID) | CUTANEOUS | Status: DC
Start: 2017-10-10 — End: 2017-10-11
  Administered 2017-10-10 (×3): 1 via TOPICAL
  Filled 2017-10-10 (×3): qty 1

## 2017-10-10 MED ORDER — METHOCARBAMOL 500 MG PO TABS
500.0000 mg | ORAL_TABLET | Freq: Three times a day (TID) | ORAL | Status: DC
  Administered 2017-10-10 (×2): 500 mg via ORAL
  Filled 2017-10-10 (×3): qty 1

## 2017-10-10 MED ORDER — METHOCARBAMOL 500 MG PO TABS
500.0000 mg | ORAL_TABLET | Freq: Three times a day (TID) | ORAL | Status: DC
  Administered 2017-10-10 – 2017-10-11 (×2): 500 mg via ORAL
  Filled 2017-10-10 (×2): qty 1
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
## 2017-10-11 LAB — BASIC METABOLIC PANEL
ANION GAP: 6 (ref 5–15)
BUN: 7 mg/dL (ref 6–20)
CALCIUM: 8 mg/dL — AB (ref 8.9–10.3)
CO2: 21 mmol/L — ABNORMAL LOW (ref 22–32)
Chloride: 113 mmol/L — ABNORMAL HIGH (ref 98–111)
Creatinine, Ser: 1.22 mg/dL (ref 0.61–1.24)
GLUCOSE: 102 mg/dL — AB (ref 70–99)
Potassium: 3.7 mmol/L (ref 3.5–5.1)
SODIUM: 140 mmol/L (ref 135–145)

## 2017-10-11 MED ORDER — OXYCODONE HCL 5 MG PO TABS: 5.0000 mg | ORAL_TABLET | Freq: Four times a day (QID) | ORAL | 0 refills | Status: AC | PRN

## 2017-10-11 MED ORDER — BACITRACIN-NEOMYCIN-POLYMYXIN 400-5-5000 EX OINT: TOPICAL_OINTMENT | Freq: Three times a day (TID) | CUTANEOUS | 0 refills | Status: AC

## 2017-10-11 MED ORDER — ACETAMINOPHEN 500 MG PO TABS: 1000.0000 mg | ORAL_TABLET | Freq: Three times a day (TID) | ORAL | 0 refills | Status: DC | PRN

## 2017-10-11 MED ORDER — METHOCARBAMOL 500 MG PO TABS: 500.0000 mg | ORAL_TABLET | Freq: Three times a day (TID) | ORAL | 0 refills | Status: AC

## 2017-10-11 NOTE — Discharge Instructions (Addendum)
Shower with wounds open Apply Neosporin to wounds prior to wrapping with gauze   Concussion, Adult A concussion is a brain injury from a direct hit (blow) to the head or body. This injury causes the brain to shake quickly back and forth inside the skull. It is caused by:  A hit to the head.  A quick and sudden movement (jolt) of the head or neck.  How fast you will get better from a concussion depends on many things like how bad your concussion was, what part of your brain was hurt, how old you are, and how healthy you were before the concussion. Recovery can take time. It is important to wait to return to activity until a doctor says it is safe and your symptoms are all gone. Follow these instructions at home: Activity  Limit activities that need a lot of thought or concentration. These include: ? Homework or work for your job. ? Watching TV. ? Computer work. ? Playing memory games and puzzles.  Rest. Rest helps the brain to heal. Make sure you: ? Get plenty of sleep at night. Do not stay up late. ? Go to bed at the same time every day. ? Rest during the day. Take naps or rest breaks when you feel tired.  It can be dangerous if you get another concussion before the first one has healed Do not do activities that could cause a second concussion, such as riding a bike or playing sports.  Ask your doctor when you can return to your normal activities, like driving, riding a bike, or using machinery. Your ability to react may be slower. Do not do these activities if you are dizzy. Your doctor will likely give you a plan for slowly going back to activities. General instructions  Take over-the-counter and prescription medicines only as told by your doctor.  Do not drink alcohol until your doctor says you can.  If it is harder than usual to remember things, write them down.  If you are easily distracted, try to do one thing at a time. For example, do not try to watch TV while making  dinner.  Talk with family members or close friends when you need to make important decisions.  Watch your symptoms and tell other people to do the same. Other problems (complications) can happen after a concussion. Older adults with a brain injury may have a higher risk of serious problems, such as a blood clot in the brain.  Tell your teachers, school nurse, school counselor, coach, Event organiser, or work Production designer, theatre/television/film about your injury and symptoms. Tell them about what you can or cannot do. They should watch for: ? More problems with attention or concentration. ? More trouble remembering or learning new information. ? More time needed to do tasks or assignments. ? Being more annoyed (irritable) or having a harder time dealing with stress. ? Any other symptoms that get worse.  Keep all follow-up visits as told by your health care provider. This is important. Prevention  It is very important that you donot get another brain injury, especially before you have healed. In rare cases, another injury can cause permanent brain damage, brain swelling, or death. You have the most risk if you get another head injury in the first 7-10 days after you were hurt before. To avoid injuries: ? Wear a seat belt when you ride in a car. ? Do not drink too much alcohol. ? Avoid activities that could make you get a second concussion,  like contact sports. ? Wear a helmet when you do activities like:  Biking.  Skiing.  Skateboarding.  Skating. ? Make your home safe by:  Removing things from the floor or stairs that could make you trip.  Using grab bars in bathrooms and handrails by stairs.  Placing non-slip mats on floors and in bathtubs.  Putting more light in dark areas. Contact a doctor if:  Your symptoms get worse.  You have new symptoms.  You keep having symptoms for more than 2 weeks. Get help right away if:  You have bad headaches, or your headaches get worse.  You have weakness in any  part of your body.  You have loss of feeling (numbness).  You feel off balance.  You keep throwing up (vomiting).  You feel more sleepy.  The black center of one eye (pupil) is bigger than the other one.  You twitch or shake violently (convulse) or have a seizure.  Your speech is not clear (is slurred).  You feel more tired, more confused, or more annoyed.  You do not recognize people or places.  You have neck pain.  It is hard to wake you up.  You have strange behavior changes.  You pass out (lose consciousness). Summary  A concussion is a brain injury from a direct hit (blow) to the head or body.  This condition is treated with rest and careful watching of symptoms.  If you keep having symptoms for more than 2 weeks, call your doctor. This information is not intended to replace advice given to you by your health care provider. Make sure you discuss any questions you have with your health care provider. Document Released: 01/28/2009 Document Revised: 01/25/2016 Document Reviewed: 01/25/2016 Elsevier Interactive Patient Education  2017 Elsevier Inc.    Pain Medicine Instructions How can pain medicine affect me? You were prescribed pain medicine. This medicine may:  Make you tired or sleepy.  Make you feel dizzy.  Affect how well you can: ? Drive ? Do certain activities.  Pain medicine may not make all of your pain go away. You should be comfortable enough to:  Move.  Breathe.  Take care of yourself.  How often should I take pain medicine and how much should I take?  Take pain medicine only as told by your doctor and only as needed for pain.  You do not need to take pain medicine if you are not having pain, unless your doctor tells you to do that.  You can take less than the prescribed dose if you find that less medicine helps your pain.  If you have very bad (severe) pain, call your doctor. Do not take more pills than told by your doctor. Do not  take pills more often than told by your doctor. What should I avoid while I am taking pain medicine? Follow these instructions after you start taking pain medicine, while you are taking the medicine, and for 8 hours after you stop taking the medicine:  Do not drive.  Do not use machinery.  Do not use power tools.  Do not sign legal documents.  Do not drink alcohol.  Do not take sleeping pills.  Do not take care of children by yourself.  Do not do any activities that involve climbing or being in high places.  Do not go into any body of water unless there is an adult nearby who can watch and help you. This includes: ? Lakes. ? Rivers. ? Oceans. ? Spas. ?  Swimming pools.  How can I keep others safe while I am taking pain medicine?  Store your pain medicine as told by your doctor. Make sure that you keep it where children and pets cannot reach it.  Do not share your pain medicine with anyone.  Do not save any leftover pills. If you have any leftover pain medicine, get rid of it or destroy it as told by your doctor. What else do I need to know about taking pain medicine?  Use a poop (stool) softener if you have trouble pooping (constipation) because of your pain medicine. Eating more fruits and vegetables also helps with constipation.  Write down the times when you take your pain medicine. Look at the times before you take your next dose of medicine.  Your pain medicine might have acetaminophen in it. Do not take any other acetaminophen while you are taking this medicine. An overdose of acetaminophen can do very bad damage to your liver. If you are taking any medicines in addition to your pain medicine, check the active ingredients on those medicines to see if acetaminophen is listed. When should I call my doctor?  Your medicine is not helping the pain.  You do either of these soon after you take the medicine: ? Throw up (vomit). ? Have watery poop (diarrhea).  You have  new pain in areas that did not hurt before.  You have an allergic reaction to your medicine. This may include: ? Feeling itchy. ? Swelling. ? Feeling dizzy. ? Getting a new rash.  You cannot put up with feeling: ? Dizzy. ? Sick to your stomach (nauseous). When should I call 911 or go to the emergency room?  You pass out (faint).  You feel very confused.  You throw up again and again.  Your skin or lips turn pale or bluish in color.  You are: ? Short of breath. ? Breathing much more slowly than usual.  You have a very bad allergic reaction to your medicine. This includes: ? Developing a swollen tongue. ? Having trouble breathing. This information is not intended to replace advice given to you by your health care provider. Make sure you discuss any questions you have with your health care provider. Document Released: 07/29/2007 Document Revised: 10/17/2015 Document Reviewed: 12/14/2013 Elsevier Interactive Patient Education  2018 [employer].    Smoking Tobacco Information Smoking tobacco will very likely harm your health. Tobacco contains a poisonous (toxic), colorless chemical called nicotine. Nicotine affects the brain and makes tobacco addictive. This change in your brain can make it hard to stop smoking. Tobacco also has other toxic chemicals that can hurt your body and raise your risk of many cancers. How can smoking tobacco affect me? Smoking tobacco can increase your chances of having serious health conditions, such as:  Cancer. Smoking is most commonly associated with lung cancer, but can lead to cancer in other parts of the body.  Chronic obstructive pulmonary disease (COPD). This is a long-term lung condition that makes it hard to breathe. It also gets worse over time.  High blood pressure (hypertension), heart disease, stroke, or heart attack.  Lung infections, such as pneumonia.  Cataracts. This is when the lenses in the eyes become clouded.  Digestive  problems. This may include peptic ulcers, heartburn, and gastroesophageal reflux disease (GERD).  Oral health problems, such as gum disease and tooth loss.  Loss of taste and smell.  Smoking can affect your appearance by causing:  Wrinkles.  Yellow or stained  teeth, fingers, and fingernails.  Smoking tobacco can also affect your social life.  Many workplaces, [employer], hotels, and public places are tobacco-free. This means that you may experience challenges in finding places to smoke when away from home.  The cost of a smoking habit can be expensive. Expenses for someone who smokes come in two ways: ? You spend money on a regular basis to buy tobacco. ? Your health care costs in the long-term are higher if you smoke.  Tobacco smoke can also affect the health of those around you. Children of smokers have greater chances of: ? Sudden infant death syndrome (SIDS). ? Ear infections. ? Lung infections.  What lifestyle changes can be made?  Do not start smoking. Quit if you already do.  To quit smoking: ? Make a plan to quit smoking and commit yourself to it. Look for programs to help you and ask your health care provider for recommendations and ideas. ? Talk with your health care provider about using nicotine replacement medicines to help you quit. Medicine replacement medicines include gum, lozenges, patches, sprays, or pills. ? Do not replace cigarette smoking with electronic cigarettes, which are commonly called e-cigarettes. The safety of e-cigarettes is not known, and some may contain harmful chemicals. ? Avoid places, people, or situations that tempt you to smoke. ? If you try to quit but return to smoking, don't give up hope. It is very common for people to try a number of times before they fully succeed. When you feel ready again, give it another try.  Quitting smoking might affect the way you eat as well as your weight. Be prepared to monitor your eating habits. Get support  in planning and following a healthy diet.  Ask your health care provider about having regular tests (screenings) to check for cancer. This may include blood tests, imaging tests, and other tests.  Exercise regularly. Consider taking walks, joining a gym, or doing yoga or exercise classes.  Develop skills to manage your stress. These skills include meditation. What are the benefits of quitting smoking? By quitting smoking, you may:  Lower your risk of getting cancer and other diseases caused by smoking.  Live longer.  Breathe better.  Lower your blood pressure and heart rate.  Stop your addiction to tobacco.  Stop creating secondhand smoke that hurts other people.  Improve your sense of taste and smell.  Look better over time, due to having fewer wrinkles and less staining.  What can happen if changes are not made? If you do not stop smoking, you may:  Get cancer and other diseases.  Develop COPD or other long-term (chronic) lung conditions.  Develop serious problems with your heart and blood vessels (cardiovascular system).  Need more tests to screen for problems caused by smoking.  Have higher, long-term healthcare costs from medicines or treatments related to smoking.  Continue to have worsening changes in your lungs, mouth, and nose.  Where to find support: To get support to quit smoking, consider:  Asking your health care provider for more information and resources.  Taking classes to learn more about quitting smoking.  Looking for local organizations that offer resources about quitting smoking.  Joining a support group for people who want to quit smoking in your local community.  Where to find more information: You may find more information about quitting smoking from:  HelpGuide.org: www.helpguide.org/articles/addictions/how-to-quit-smoking.htm  BankRights[employer]uySmokefree.gov: smokefree.gov  American Lung Association: www.lung.org  Contact a health care provider  if:  You have problems breathing.  Your lips, nose, or fingers turn blue.  You have chest pain.  You are coughing up blood.  You feel faint or you pass out.  You have other noticeable changes that cause you to worry. Summary  Smoking tobacco can negatively affect your health, the health of those around you, your finances, and your social life.  Do not start smoking. Quit if you already do. If you need help quitting, ask your health care provider.  Think about joining a support group for people who want to quit smoking in your local community. There are many effective programs that will help you to quit this behavior. This information is not intended to replace advice given to you by your health care provider. Make sure you discuss any questions you have with your health care provider. Document Released: 02/25/2016 Document Revised: 02/25/2016 Document Reviewed: 02/25/2016 Elsevier Interactive Patient Education  [employer]2018 Elsevier Inc.
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
## 2017-10-11 NOTE — Progress Notes (Signed)
Pt instructions reviewed with pt, Pt wife instructed on how to change dressing Pt discharged home
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
## 2017-10-11 NOTE — Plan of Care (Signed)
  Problem: Activity: Goal: Risk for activity intolerance will decrease Outcome: Progressing   Problem: Nutrition: Goal: Adequate nutrition will be maintained Outcome: Progressing   Problem: Elimination: Goal: Will not experience complications related to urinary retention Outcome: Progressing   Problem: Skin Integrity: Goal: Risk for impaired skin integrity will decrease Outcome: Progressing   
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
## 2017-10-12 ENCOUNTER — Encounter (HOSPITAL_COMMUNITY): Payer: Self-pay

## 2017-12-21 ENCOUNTER — Emergency Department (HOSPITAL_COMMUNITY): Payer: 59

## 2017-12-21 ENCOUNTER — Emergency Department (HOSPITAL_COMMUNITY)
Admission: EM | Admit: 2017-12-21 | Discharge: 2017-12-21 | Disposition: A | Discharge status: Home / Self Care | Payer: 59 | Attending: Emergency Medicine | Admitting: Emergency Medicine

## 2017-12-21 DIAGNOSIS — M549 Dorsalgia, unspecified: Secondary | ICD-10-CM | POA: Diagnosis present

## 2017-12-21 DIAGNOSIS — I10 Essential (primary) hypertension: Secondary | ICD-10-CM | POA: Insufficient documentation

## 2017-12-21 DIAGNOSIS — M5126 Other intervertebral disc displacement, lumbar region: Secondary | ICD-10-CM | POA: Diagnosis not present

## 2017-12-21 DIAGNOSIS — R159 Full incontinence of feces: Secondary | ICD-10-CM | POA: Insufficient documentation

## 2017-12-21 DIAGNOSIS — Z79899 Other long term (current) drug therapy: Secondary | ICD-10-CM | POA: Insufficient documentation

## 2017-12-21 MED ORDER — CYCLOBENZAPRINE HCL 10 MG PO TABS: 10.0000 mg | ORAL_TABLET | Freq: Two times a day (BID) | ORAL | 0 refills | Status: DC | PRN

## 2017-12-21 MED ORDER — PREDNISONE 10 MG (21) PO TBPK: ORAL_TABLET | Freq: Every day | ORAL | 0 refills | Status: AC

## 2017-12-21 MED ORDER — KETOROLAC TROMETHAMINE 60 MG/2ML IM SOLN
60.0000 mg | Freq: Once | INTRAMUSCULAR | Status: AC
  Administered 2017-12-21: 60 mg via INTRAMUSCULAR
  Filled 2017-12-21: qty 2

## 2017-12-21 MED ORDER — HYDROMORPHONE HCL 1 MG/ML IJ SOLN
1.0000 mg | Freq: Once | INTRAMUSCULAR | Status: AC
  Administered 2017-12-21: 1 mg via INTRAVENOUS
  Filled 2017-12-21: qty 1

## 2017-12-21 MED ORDER — LORAZEPAM 2 MG/ML IJ SOLN: 1.0000 mg | Freq: Once | INTRAMUSCULAR | Status: DC | PRN

## 2017-12-21 NOTE — ED Notes (Signed)
Patient verbalizes understanding of discharge instructions. Opportunity for questioning and answers were provided. Armband removed by staff, pt discharged from ED ambulatory with male companion.  

## 2017-12-21 NOTE — ED Notes (Signed)
Patient transported to MRI 
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
## 2017-12-21 NOTE — Discharge Instructions (Addendum)
Thank you for allowing me to care for you today in the Emergency Department.   Call [facility] to schedule a follow up appointment.  For the prednisone: take 6 tabs by mouth daily  for 2 days, then 5 tabs for 2 days, then 4 tabs for 2 days, then 3 tabs for 2 days, 2 tabs for 2 days, then 1 tab by mouth daily for 2 days.  You can take 600 mg of ibuprofen with food or 650 mg of Tylenol every 6 hours for pain.  You can also take 1 tablet of Flexeril up to 2 times daily to help with muscle pain and spasms.  Return to the emergency department if you develop new or worsening symptoms including worsening ability to control your urine or stool, a new fall or injury with new weakness or numbness, a fever, or other new, concerning symptoms.
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
## 2019-05-15 ENCOUNTER — Encounter (HOSPITAL_COMMUNITY): Payer: Self-pay | Admitting: Emergency Medicine

## 2019-05-15 ENCOUNTER — Emergency Department (HOSPITAL_COMMUNITY): Payer: Self-pay

## 2019-05-15 ENCOUNTER — Emergency Department (HOSPITAL_COMMUNITY)
Admission: EM | Admit: 2019-05-15 | Discharge: 2019-05-15 | Disposition: A | Discharge status: Home / Self Care | Payer: Self-pay | Attending: Emergency Medicine | Admitting: Emergency Medicine

## 2019-05-15 ENCOUNTER — Other Ambulatory Visit: Payer: Self-pay

## 2019-05-15 DIAGNOSIS — R1084 Generalized abdominal pain: Secondary | ICD-10-CM | POA: Insufficient documentation

## 2019-05-15 DIAGNOSIS — R112 Nausea with vomiting, unspecified: Secondary | ICD-10-CM | POA: Insufficient documentation

## 2019-05-15 DIAGNOSIS — F1910 Other psychoactive substance abuse, uncomplicated: Secondary | ICD-10-CM | POA: Insufficient documentation

## 2019-05-15 DIAGNOSIS — I10 Essential (primary) hypertension: Secondary | ICD-10-CM | POA: Insufficient documentation

## 2019-05-15 LAB — RAPID URINE DRUG SCREEN, HOSP PERFORMED
Amphetamines: NOT DETECTED
Barbiturates: NOT DETECTED
Benzodiazepines: NOT DETECTED
Cocaine: POSITIVE — AB
Opiates: POSITIVE — AB
Tetrahydrocannabinol: POSITIVE — AB

## 2019-05-15 LAB — CBC
HCT: 41.4 % (ref 39.0–52.0)
Hemoglobin: 14.2 g/dL (ref 13.0–17.0)
MCH: 31.9 pg (ref 26.0–34.0)
MCHC: 34.3 g/dL (ref 30.0–36.0)
MCV: 93 fL (ref 80.0–100.0)
Platelets: 166 10*3/uL (ref 150–400)
RBC: 4.45 MIL/uL (ref 4.22–5.81)
RDW: 12.8 % (ref 11.5–15.5)
WBC: 6.3 10*3/uL (ref 4.0–10.5)
nRBC: 0 % (ref 0.0–0.2)

## 2019-05-15 LAB — COMPREHENSIVE METABOLIC PANEL
ALT: 22 U/L (ref 0–44)
AST: 25 U/L (ref 15–41)
Albumin: 3.8 g/dL (ref 3.5–5.0)
Alkaline Phosphatase: 43 U/L (ref 38–126)
Anion gap: 10 (ref 5–15)
BUN: 10 mg/dL (ref 6–20)
CO2: 23 mmol/L (ref 22–32)
Calcium: 8.9 mg/dL (ref 8.9–10.3)
Chloride: 107 mmol/L (ref 98–111)
Creatinine, Ser: 1.38 mg/dL — ABNORMAL HIGH (ref 0.61–1.24)
GFR calc Af Amer: >60 mL/min (ref >60)
GFR calc non Af Amer: >60 mL/min (ref >60)
Glucose, Bld: 96 mg/dL (ref 70–99)
Potassium: 3.6 mmol/L (ref 3.5–5.1)
Sodium: 140 mmol/L (ref 135–145)
Total Bilirubin: 1.1 mg/dL (ref 0.3–1.2)
Total Protein: 6.5 g/dL (ref 6.5–8.1)

## 2019-05-15 LAB — URINALYSIS, ROUTINE W REFLEX MICROSCOPIC
Bilirubin Urine: NEGATIVE
Glucose, UA: NEGATIVE mg/dL
Hgb urine dipstick: NEGATIVE
Ketones, ur: 5 mg/dL — AB
Leukocytes,Ua: NEGATIVE
Nitrite: NEGATIVE
Protein, ur: NEGATIVE mg/dL
Specific Gravity, Urine: 1.026 (ref 1.005–1.030)
pH: 6 (ref 5.0–8.0)

## 2019-05-15 LAB — LIPASE, BLOOD: Lipase: 21 U/L (ref 11–51)

## 2019-05-15 MED ORDER — MORPHINE SULFATE (PF) 4 MG/ML IV SOLN
4.0000 mg | Freq: Once | INTRAVENOUS | Status: AC
  Administered 2019-05-15: 4 mg via INTRAVENOUS
  Filled 2019-05-15: qty 1

## 2019-05-15 MED ORDER — SODIUM CHLORIDE 0.9 % IV BOLUS
1000.0000 mL | Freq: Once | INTRAVENOUS | Status: AC
  Administered 2019-05-15: 1000 mL via INTRAVENOUS

## 2019-05-15 MED ORDER — ONDANSETRON 4 MG PO TBDP: 4.0000 mg | ORAL_TABLET | Freq: Three times a day (TID) | ORAL | 0 refills | Status: AC | PRN

## 2019-05-15 MED ORDER — ONDANSETRON HCL 4 MG/2ML IJ SOLN
4.0000 mg | Freq: Once | INTRAMUSCULAR | Status: AC
  Administered 2019-05-15: 4 mg via INTRAVENOUS
  Filled 2019-05-15: qty 2

## 2019-05-15 MED ORDER — IOHEXOL 300 MG/ML  SOLN
100.0000 mL | Freq: Once | INTRAMUSCULAR | Status: AC | PRN
  Administered 2019-05-15: 100 mL via INTRAVENOUS

## 2019-05-15 MED ORDER — SODIUM CHLORIDE 0.9% FLUSH: 3.0000 mL | Freq: Once | INTRAVENOUS | Status: DC
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
## 2019-05-15 NOTE — ED Provider Notes (Signed)
Medical screening examination/treatment/procedure(s) were conducted as a shared visit with non-physician practitioner(s) and myself.  I personally evaluated the patient during the encounter.  Clinical Impression:   Final diagnoses:  Generalized abdominal pain  Non-intractable vomiting with nausea, unspecified vomiting type    This patient is a very pleasant 29 year old male presenting with some abdominal discomfort with some nausea and vomiting.  On my exam is a very soft abdomen, mild diffuse tenderness but no guarding, slight increased bowel sounds, well-appearing and feels better after Zofran.  Vitals are normal, labs pending, pursue abnormal labs as needed.   Eber Hong, MD 05/17/19 [phone]

## 2019-05-15 NOTE — Discharge Instructions (Signed)
Make sure you are staying hydrated drinking plenty of fluids.  Take Zofran for nausea/vomiting.  Return to the Emergency Department immediately if you experience any worsening abdominal pain, fever, persistent nausea and vomiting, inability keep any food down, pain with urination, blood in your urine or any other worsening or concerning symptoms.

## 2019-05-15 NOTE — ED Triage Notes (Signed)
Patient arrived with EMS from home reports emesis this morning , received Zofran 4 mg IV and NS IV 500 ml by EMS prior to arrival , no emesis at triage , denies fever or chills .
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
## 2020-02-29 ENCOUNTER — Other Ambulatory Visit: Payer: Self-pay

## 2020-02-29 ENCOUNTER — Encounter (HOSPITAL_COMMUNITY): Payer: Self-pay | Admitting: Emergency Medicine

## 2020-02-29 ENCOUNTER — Emergency Department (HOSPITAL_COMMUNITY)
Admission: EM | Admit: 2020-02-29 | Discharge: 2020-03-01 | Disposition: A | Discharge status: Home / Self Care | Payer: BC Managed Care – PPO | Attending: Emergency Medicine | Admitting: Emergency Medicine

## 2020-02-29 DIAGNOSIS — R1110 Vomiting, unspecified: Secondary | ICD-10-CM | POA: Insufficient documentation

## 2020-02-29 DIAGNOSIS — Z5321 Procedure and treatment not carried out due to patient leaving prior to being seen by health care provider: Secondary | ICD-10-CM | POA: Diagnosis not present

## 2020-02-29 DIAGNOSIS — R1084 Generalized abdominal pain: Secondary | ICD-10-CM | POA: Diagnosis not present

## 2020-02-29 LAB — URINALYSIS, ROUTINE W REFLEX MICROSCOPIC
Bilirubin Urine: NEGATIVE
Glucose, UA: NEGATIVE mg/dL
Hgb urine dipstick: NEGATIVE
Ketones, ur: NEGATIVE mg/dL
Leukocytes,Ua: NEGATIVE
Nitrite: NEGATIVE
Protein, ur: NEGATIVE mg/dL
Specific Gravity, Urine: 1.032 — ABNORMAL HIGH (ref 1.005–1.030)
pH: 5 (ref 5.0–8.0)

## 2020-02-29 LAB — CBC
HCT: 43.9 % (ref 39.0–52.0)
Hemoglobin: 15.3 g/dL (ref 13.0–17.0)
MCH: 32.3 pg (ref 26.0–34.0)
MCHC: 34.9 g/dL (ref 30.0–36.0)
MCV: 92.6 fL (ref 80.0–100.0)
Platelets: 165 10*3/uL (ref 150–400)
RBC: 4.74 MIL/uL (ref 4.22–5.81)
RDW: 12.7 % (ref 11.5–15.5)
WBC: 3.6 10*3/uL — ABNORMAL LOW (ref 4.0–10.5)
nRBC: 0 % (ref 0.0–0.2)

## 2020-02-29 LAB — COMPREHENSIVE METABOLIC PANEL
ALT: 28 U/L (ref 0–44)
AST: 26 U/L (ref 15–41)
Albumin: 3.6 g/dL (ref 3.5–5.0)
Alkaline Phosphatase: 36 U/L — ABNORMAL LOW (ref 38–126)
Anion gap: 10 (ref 5–15)
BUN: 9 mg/dL (ref 6–20)
CO2: 25 mmol/L (ref 22–32)
Calcium: 8.7 mg/dL — ABNORMAL LOW (ref 8.9–10.3)
Chloride: 107 mmol/L (ref 98–111)
Creatinine, Ser: 1.48 mg/dL — ABNORMAL HIGH (ref 0.61–1.24)
GFR, Estimated: >60 mL/min (ref >60)
Glucose, Bld: 95 mg/dL (ref 70–99)
Potassium: 4.1 mmol/L (ref 3.5–5.1)
Sodium: 142 mmol/L (ref 135–145)
Total Bilirubin: 1 mg/dL (ref 0.3–1.2)
Total Protein: 6.3 g/dL — ABNORMAL LOW (ref 6.5–8.1)

## 2020-02-29 LAB — LIPASE, BLOOD: Lipase: 23 U/L (ref 11–51)

## 2020-02-29 NOTE — ED Triage Notes (Signed)
Patient reports generalized abdominal pain with emesis onset 2 days ago , no diarrhea or fever .

## 2020-03-01 NOTE — ED Notes (Signed)
Pt decided to leave, stated he couldn't wait any longer.

## 2020-04-18 ENCOUNTER — Other Ambulatory Visit: Payer: Self-pay

## 2020-04-18 ENCOUNTER — Emergency Department (HOSPITAL_COMMUNITY)
Admission: EM | Admit: 2020-04-18 | Discharge: 2020-04-19 | Disposition: A | Discharge status: Home / Self Care | Payer: BC Managed Care – PPO | Attending: Emergency Medicine | Admitting: Emergency Medicine

## 2020-04-18 ENCOUNTER — Encounter (HOSPITAL_COMMUNITY): Payer: Self-pay | Admitting: *Deleted

## 2020-04-18 DIAGNOSIS — K047 Periapical abscess without sinus: Secondary | ICD-10-CM | POA: Diagnosis not present

## 2020-04-18 DIAGNOSIS — K0889 Other specified disorders of teeth and supporting structures: Secondary | ICD-10-CM | POA: Diagnosis present

## 2020-04-18 DIAGNOSIS — I10 Essential (primary) hypertension: Secondary | ICD-10-CM | POA: Diagnosis not present

## 2020-04-18 LAB — CBC
HCT: 42.4 % (ref 39.0–52.0)
Hemoglobin: 14.7 g/dL (ref 13.0–17.0)
MCH: 32.7 pg (ref 26.0–34.0)
MCHC: 34.7 g/dL (ref 30.0–36.0)
MCV: 94.4 fL (ref 80.0–100.0)
Platelets: 183 10*3/uL (ref 150–400)
RBC: 4.49 MIL/uL (ref 4.22–5.81)
RDW: 13.2 % (ref 11.5–15.5)
WBC: 8 10*3/uL (ref 4.0–10.5)
nRBC: 0 % (ref 0.0–0.2)

## 2020-04-18 LAB — BASIC METABOLIC PANEL
Anion gap: 10 (ref 5–15)
BUN: 10 mg/dL (ref 6–20)
CO2: 25 mmol/L (ref 22–32)
Calcium: 9.1 mg/dL (ref 8.9–10.3)
Chloride: 105 mmol/L (ref 98–111)
Creatinine, Ser: 1.38 mg/dL — ABNORMAL HIGH (ref 0.61–1.24)
GFR, Estimated: >60 mL/min (ref >60)
Glucose, Bld: 110 mg/dL — ABNORMAL HIGH (ref 70–99)
Potassium: 3.6 mmol/L (ref 3.5–5.1)
Sodium: 140 mmol/L (ref 135–145)

## 2020-04-18 MED ORDER — OXYCODONE-ACETAMINOPHEN 5-325 MG PO TABS
1.0000 | ORAL_TABLET | ORAL | Status: DC | PRN
  Administered 2020-04-18: 1 via ORAL
  Filled 2020-04-18: qty 1

## 2020-04-18 NOTE — ED Triage Notes (Signed)
Pt is here due to swelling and pain on the inside of his right cheek.  He woke up with this this am.  No sob, painful.

## 2020-04-19 MED ORDER — CLINDAMYCIN HCL 150 MG PO CAPS
300.0000 mg | ORAL_CAPSULE | Freq: Once | ORAL | Status: AC
  Administered 2020-04-19: 300 mg via ORAL
  Filled 2020-04-19: qty 2

## 2020-04-19 MED ORDER — PENICILLIN V POTASSIUM 500 MG PO TABS: 500.0000 mg | ORAL_TABLET | Freq: Four times a day (QID) | ORAL | 0 refills | Status: AC

## 2020-04-19 MED ORDER — KETOROLAC TROMETHAMINE 30 MG/ML IJ SOLN
30.0000 mg | Freq: Once | INTRAMUSCULAR | Status: AC
  Administered 2020-04-19: 30 mg via INTRAVENOUS
  Filled 2020-04-19: qty 1
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
